# Patient Record
Sex: Male | Born: 2005 | Race: Black or African American | Hispanic: No | Marital: Single | Smoking: Never smoker
Health system: Southern US, Community
[De-identification: ages and names within clinical notes are randomized; demographics above are authoritative.]

## PROBLEM LIST (undated history)

## (undated) DIAGNOSIS — T8859XA Other complications of anesthesia, initial encounter: Secondary | ICD-10-CM

---
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
## 2023-02-28 ENCOUNTER — Emergency Department
Admission: EM | Admit: 2023-02-28 | Discharge: 2023-03-01 | Disposition: A | Discharge status: Home / Self Care | Payer: Medicaid Other | Attending: Emergency Medicine | Admitting: Emergency Medicine

## 2023-02-28 ENCOUNTER — Other Ambulatory Visit: Payer: Self-pay

## 2023-02-28 DIAGNOSIS — J029 Acute pharyngitis, unspecified: Secondary | ICD-10-CM | POA: Diagnosis not present

## 2023-02-28 DIAGNOSIS — I10 Essential (primary) hypertension: Secondary | ICD-10-CM | POA: Insufficient documentation

## 2023-02-28 LAB — GROUP A STREP BY PCR: Group A Strep by PCR: NOT DETECTED

## 2023-02-28 NOTE — ED Triage Notes (Signed)
Pt presents to ER with c/o sore throat that has been going on for around one week.  Pt states pain is mostly in upper left side of his throat, with some radiation to his left jaw around his ear.  Pt denies pain when opening mouth.  Denies any fever, sick contacts, or cold/flu symptoms.  Pt states he does have some pain with swallowing.  Pt otherwise A&O x4 and in NAD in triage.

## 2023-03-01 MED ORDER — DEXAMETHASONE 10 MG/ML FOR PEDIATRIC ORAL USE
10.0000 mg | Freq: Once | INTRAMUSCULAR | Status: AC
  Administered 2023-03-01: 10 mg via ORAL
  Filled 2023-03-01: qty 1

## 2023-03-01 MED ORDER — AMOXICILLIN-POT CLAVULANATE 875-125 MG PO TABS
1.0000 | ORAL_TABLET | Freq: Once | ORAL | Status: AC
  Administered 2023-03-01: 1 via ORAL
  Filled 2023-03-01: qty 1

## 2023-03-01 MED ORDER — AMOXICILLIN-POT CLAVULANATE 875-125 MG PO TABS: 1.0000 | ORAL_TABLET | Freq: Two times a day (BID) | ORAL | 0 refills | Status: AC

## 2023-03-01 NOTE — Discharge Instructions (Signed)
Please take the full course of prescribed antibiotics.  Follow-up with your primary care doctor, or you could also choose to follow-up with the ENT specialists listed in this information.  If you feel like you are getting worse instead of better, such as having trouble swallowing your saliva or any food or drink, or you feel like the swelling is getting worse, please return immediately to the emergency department.

## 2023-03-01 NOTE — ED Notes (Signed)
Pt dc to home. Voices understanding of DC instructions. Pt ambulatory out of dept with steady gait

## 2023-03-01 NOTE — ED Provider Notes (Signed)
[facility] Provider Note    Event Date/Time   First MD Initiated Contact with Patient 02/28/23 2344     (approximate)   History   Sore Throat   HPI Chase Walton is a 17 y.o. male with no chronic medical issues who presents for evaluation of sore throat.  He said he has had a sore throat for about a week, but it feels like it is gradually getting a little bit worse, now he has some swelling below his left lower jaw and his neck.  It hurts for him to swallow, but it is not difficult to do so.  He is not having any trouble breathing.  He feels like the pain is radiating up through the left side of his face and towards his ear but he is not specifically having ear pain.  He has not had any dental problems or issues like this in the past.  No neck stiffness.  No fever, nausea, vomiting, chest pain, shortness of breath, cough, nor abdominal pain.     Physical Exam   Triage Vital Signs: ED Triage Vitals  Enc Vitals Group     BP 02/28/23 2247 (!) 146/93     Pulse Rate 02/28/23 2247 68     Resp 02/28/23 2247 17     Temp 02/28/23 2247 98.2 F (36.8 C)     Temp Source 02/28/23 2247 Oral     SpO2 02/28/23 2247 100 %     Weight 02/28/23 2246 72.6 kg (160 lb)     Height 02/28/23 2246 1.88 m ( )     Head Circumference --      Peak Flow --      Pain Score 02/28/23 2245 7     Pain Loc --      Pain Edu? --      Excl. in GC? --     Most recent vital signs: Vitals:   02/28/23 2247  BP: (!) 146/93  Pulse: 68  Resp: 17  Temp: 98.2 F (36.8 C)  SpO2: 100%    General: Awake, no distress.  Well-appearing, healthy at baseline. HEENT: No obvious angioedema.  Patient has some palpable induration or lymphadenopathy beneath her his left mandible and he said that is the area that is tender.  It does not extend down his neck nor up into his face.  He has no tenderness to palpation of his sinuses nor of his face with no obvious facial swelling or  cellulitis.  Ears are clear and clean bilaterally with normal-appearing tympanic membranes.  His oropharynx appears normal and there is no obvious tonsillitis, posterior oropharynx swelling suggestive of peritonsillar abscess, and beneath his tongue is soft, nontender, and not swollen nor indurated.  No obvious odontogenic infection including Ludwig's angina. CV:  Good peripheral perfusion.  Resp:  Normal effort. Speaking easily and comfortably, no accessory muscle usage nor intercostal retractions.   Abd:  No distention.  Other:  No tripoding, no drooling, speaking softly but clearly with a normal voice.   ED Results / Procedures / Treatments   Labs (all labs ordered are listed, but only abnormal results are displayed) Labs Reviewed  GROUP A STREP BY PCR      PROCEDURES:  Critical Care performed: No  Procedures    IMPRESSION / MDM / ASSESSMENT AND PLAN / ED COURSE  I reviewed the triage vital signs and the nursing notes.  Differential diagnosis includes, but is not limited to, tonsillitis, pharyngitis, neck abscess, peritonsillar abscess, facial abscess or cellulitis.  Patient's presentation is most consistent with acute, uncomplicated illness.  Labs/studies ordered: Group A strep PCR ordered in triage  Interventions/Medications given:  Medications  amoxicillin-clavulanate (AUGMENTIN) 875-125 MG per tablet 1 tablet (1 tablet Oral Given 03/01/23 0030)  dexamethasone (DECADRON) 10 MG/ML injection for Pediatric ORAL use 10 mg (10 mg Oral Given 03/01/23 0030)    (Note:  hospital course my include additional interventions and/or labs/studies not listed above.)   Patient is well-appearing and in no distress, vital signs normal other than hypertension which I think is situational.  Overall his physical exam is reassuring.  He does not have an obvious peritonsillar abscess and again his physical exam is unremarkable except for some swelling beneath  the left side of his mandible which could be reactive or could be consistent with an infectious process or developing phlegmon.  I talked with him about all of this and explained that I could get a CT of the neck to be certain, but I strongly suspect based on his clinical presentation that the plan will be the same regardless: Trial of outpatient antibiotics and a dose of Decadron to help with the swelling, with strict return precautions if he gets worse.  He understands and would prefer not to do the imaging tonight which I think is appropriate as it would likely be unnecessary, not change the clinical course, and quite expensive as well as a unnecessary dose of radiation.  However I gave him strict return precautions both verbally and in the paperwork that if he feels like the swelling or any other symptoms are getting worse, he should return immediately.  He can follow-up with his primary care provider, but I also gave him follow-up information with the ENT clinic.      FINAL CLINICAL IMPRESSION(S) / ED DIAGNOSES   Final diagnoses:  Acute pharyngitis, unspecified etiology     Rx / DC Orders   ED Discharge Orders          Ordered    amoxicillin-clavulanate (AUGMENTIN) 875-125 MG tablet  2 times daily        03/01/23 0051             Note:  This document was prepared using Dragon voice recognition software and may include unintentional dictation errors.   Loleta Rose, MD 03/01/23 [phone]

## 2024-02-17 ENCOUNTER — Other Ambulatory Visit: Payer: Self-pay

## 2024-02-17 ENCOUNTER — Emergency Department

## 2024-02-17 ENCOUNTER — Emergency Department
Admission: EM | Admit: 2024-02-17 | Discharge: 2024-02-17 | Disposition: A | Discharge status: Home / Self Care | Attending: Emergency Medicine | Admitting: Emergency Medicine

## 2024-02-17 DIAGNOSIS — W25XXXA Contact with sharp glass, initial encounter: Secondary | ICD-10-CM | POA: Diagnosis not present

## 2024-02-17 DIAGNOSIS — S51012A Laceration without foreign body of left elbow, initial encounter: Secondary | ICD-10-CM | POA: Insufficient documentation

## 2024-02-17 DIAGNOSIS — Z23 Encounter for immunization: Secondary | ICD-10-CM | POA: Insufficient documentation

## 2024-02-17 DIAGNOSIS — Y93G1 Activity, food preparation and clean up: Secondary | ICD-10-CM | POA: Diagnosis not present

## 2024-02-17 MED ORDER — CEPHALEXIN 500 MG PO CAPS: 500.0000 mg | ORAL_CAPSULE | Freq: Three times a day (TID) | ORAL | 0 refills | Status: AC

## 2024-02-17 MED ORDER — LIDOCAINE HCL 2 % IJ SOLN
20.0000 mL | Freq: Once | INTRAMUSCULAR | Status: DC
Start: 2024-02-17 — End: 2024-02-17

## 2024-02-17 MED ORDER — LIDOCAINE HCL (PF) 1 % IJ SOLN: 5.0000 mL | Freq: Once | INTRAMUSCULAR | Status: DC

## 2024-02-17 MED ORDER — TRANEXAMIC ACID FOR EPISTAXIS
500.0000 mg | Freq: Once | TOPICAL | Status: AC
  Administered 2024-02-17: 500 mg via TOPICAL
  Filled 2024-02-17: qty 10

## 2024-02-17 MED ORDER — TETANUS-DIPHTH-ACELL PERTUSSIS 5-2.5-18.5 LF-MCG/0.5 IM SUSY
0.5000 mL | PREFILLED_SYRINGE | Freq: Once | INTRAMUSCULAR | Status: AC
Start: 2024-02-17 — End: 2024-02-17
  Administered 2024-02-17: 0.5 mL via INTRAMUSCULAR
  Filled 2024-02-17: qty 0.5

## 2024-02-17 MED ORDER — LIDOCAINE HCL (PF) 2 % IJ SOLN
20.0000 mL | Freq: Once | INTRAMUSCULAR | Status: DC
Start: 2024-02-17 — End: 2024-02-18
  Filled 2024-02-17: qty 20

## 2024-02-17 MED ORDER — LIDOCAINE-EPINEPHRINE-TETRACAINE (LET) TOPICAL GEL
3.0000 mL | Freq: Once | TOPICAL | Status: AC
  Administered 2024-02-17: 3 mL via TOPICAL
  Filled 2024-02-17: qty 3

## 2024-02-17 MED ORDER — CEPHALEXIN 500 MG PO CAPS
500.0000 mg | ORAL_CAPSULE | Freq: Once | ORAL | Status: AC
  Administered 2024-02-17: 500 mg via ORAL
  Filled 2024-02-17: qty 1

## 2024-02-17 NOTE — ED Triage Notes (Signed)
 Pt was cleaning up glass and was cleaning it up and then he hit his elbow on the glass cutting it.

## 2024-02-17 NOTE — ED Provider Notes (Signed)
 [facility] Provider Note    Event Date/Time   First MD Initiated Contact with Patient 02/17/24 1935     (approximate)   History   Laceration   HPI  Chase Walton is a 18 y.o. male with no significant past medical history presents emergency department with a laceration to the left elbow.  Patient was cleaning up glass when he leaned back on a bottle that was broken in a trash bag and cut his left elbow.  Unsure of his last Tdap.  Denies numbness or tingling.  Has full range of motion.      Physical Exam   Triage Vital Signs: ED Triage Vitals  Encounter Vitals Group     BP 02/17/24 1851 (!) 143/93     Systolic BP Percentile --      Diastolic BP Percentile --      Pulse Rate 02/17/24 1851 82     Resp 02/17/24 1851 18     Temp 02/17/24 1851 97.8 F (36.6 C)     Temp Source 02/17/24 1851 Oral     SpO2 02/17/24 1851 98 %     Weight 02/17/24 1852 170 lb (77.1 kg)     Height 02/17/24 1852 6\' 2"  (1.88 m)     Head Circumference --      Peak Flow --      Pain Score 02/17/24 1852 0     Pain Loc --      Pain Education --      Exclude from Growth Chart --     Most recent vital signs: Vitals:   02/17/24 1851  BP: (!) 143/93  Pulse: 82  Resp: 18  Temp: 97.8 F (36.6 C)  SpO2: 98%     General: Awake, no distress.   CV:  Good peripheral perfusion. regular rate and  rhythm Resp:  Normal effort.  Abd:  No distention.   Other:  Left elbow with laceration proximal to the joint of the elbow, bursa area appears to be very swollen, active bleeding noted, pressure dressing applied   ED Results / Procedures / Treatments   Labs (all labs ordered are listed, but only abnormal results are displayed) Labs Reviewed - No data to display   EKG     RADIOLOGY X-ray of the left elbow CT left elbow    PROCEDURES:   .Laceration Repair  Date/Time: 02/17/2024 10:58 PM  Performed by: Delsie Figures, PA-C Authorized by: Delsie Figures, PA-C   Consent:    Consent obtained:  Verbal   Consent given by:  Patient   Risks, benefits, and alternatives were discussed: yes     Risks discussed:  Pain, retained foreign body, tendon damage, poor cosmetic result, need for additional repair, infection, nerve damage, poor wound healing and vascular damage   Alternatives discussed:  Referral Universal protocol:    Procedure explained and questions answered to patient or proxy's satisfaction: yes     Immediately prior to procedure, a time out was called: yes     Patient identity confirmed:  Verbally with patient Anesthesia:    Anesthesia method:  Topical application and local infiltration   Topical anesthetic:  LET   Local anesthetic:  Lidocaine 2% WITH epi Laceration details:    Location:  Shoulder/arm   Shoulder/arm location:  L upper arm   Length (cm):  5 Pre-procedure details:    Preparation:  Patient was prepped and draped in usual sterile fashion and imaging obtained to evaluate for  foreign bodies Exploration:    Limited defect created (wound extended): no     Hemostasis achieved with:  Direct pressure, LET and epinephrine   Imaging obtained: x-ray     Imaging outcome: foreign body not noted     Wound exploration: wound explored through full range of motion and entire depth of wound visualized     Wound extent: areolar tissue not violated, fascia not violated, no foreign body, no signs of injury, no nerve damage, no tendon damage and no underlying fracture     Contaminated: no   Treatment:    Area cleansed with:  Povidone-iodine and saline   Amount of cleaning:  Standard   Irrigation solution:  Sterile saline   Irrigation method:  Pressure wash, syringe and tap   Debridement:  None   Undermining:  None   Scar revision: no     Layers/structures repaired:  Deep subcutaneous Deep subcutaneous:    Suture size:  4-0   Suture material:  Vicryl   Suture technique:  Simple interrupted   Number of sutures:  5 Skin  repair:    Repair method:  Sutures   Suture size:  4-0   Suture material:  Nylon   Suture technique:  Simple interrupted   Number of sutures:  7 Approximation:    Approximation:  Close Repair type:    Repair type:  Intermediate Post-procedure details:    Dressing:  Non-adherent dressing and bulky dressing   Procedure completion:  Tolerated well, no immediate complications Comments:     Patient had a lot of bleeding, had remove clots, bursa was very swollen, no tendon injury noted, no pulsatile bleeding noted  Chief Complaint  Patient presents with   Laceration      MEDICATIONS ORDERED IN ED: Medications  lidocaine HCl (PF) (XYLOCAINE) 2 % injection 20 mL (has no administration in time range)  cephALEXin (KEFLEX) capsule 500 mg (has no administration in time range)  lidocaine-EPINEPHrine-tetracaine (LET) topical gel (3 mLs Topical Given by Other 02/17/24 2007)  Tdap (BOOSTRIX) injection 0.5 mL (0.5 mLs Intramuscular Given 02/17/24 2024)  tranexamic acid (CYKLOKAPRON) 1000 MG/10ML topical solution 500 mg (500 mg Topical Given by Other 02/17/24 2205)     IMPRESSION / MDM / ASSESSMENT AND PLAN / ED COURSE  I reviewed the triage vital signs and the nursing notes.                              Differential diagnosis includes, but is not limited to, laceration, laceration of the bursa, laceration into the joint space, tendon injury, arterial injury, venous injury  Patient's presentation is most consistent with acute illness / injury with system symptoms.   X-ray left elbow independently reviewed interpreted by me as being negative for any acute abnormality, confirmed by radiology  While trying to suture the area we had already applied let, use lidocaine 2% with epinephrine, bleeding continues, large clots removed, pressure was applied, the bursa appears to be swollen at this time.  Discussed with Dr. Cam Cava, states get a CT and try TXA soaked on gauze.   CT of the left elbow,  independently reviewed interpreted by me as being negative for any acute abnormality  Dr.Mian into see the patient.  States the bleeding does not appear to be arterial.  Go ahead and suture if CT negative and apply pressure dressing.  I did explain these findings to the patient and his mother.  They are  to call orthopedics tomorrow for a recheck.  Pressure dressing was applied.  Tdap was updated.  He was given Keflex p.o. here prior to discharge.  Tylenol/ibuprofen for pain if needed.  Return if worsening.  They are both in agreement treatment plan.  He is discharged stable condition.   FINAL CLINICAL IMPRESSION(S) / ED DIAGNOSES   Final diagnoses:  Elbow laceration, left, initial encounter     Rx / DC Orders   ED Discharge Orders          Ordered    cephALEXin (KEFLEX) 500 MG capsule  3 times daily        02/17/24 2257             Note:  This document was prepared using Dragon voice recognition software and may include unintentional dictation errors.    Delsie Figures, PA-C 02/17/24 2302    Collis Deaner, MD 02/18/24 [phone]

## 2024-02-17 NOTE — Discharge Instructions (Signed)
 Follow-up with your regular doctor as needed.  Follow-up with orthopedics tomorrow or the next day.  Please call them for an appointment.  Keep the pressure bandage on the elbow for at least 24 hours.  After that we changed the dressing please rewrap it with the Ace wrap on top of the bandage.  Return the emergency department if it is worsening.  Take the antibiotic as prescribed.  Tylenol or ibuprofen for pain as needed

## 2024-11-13 ENCOUNTER — Ambulatory Visit: Payer: Self-pay

## 2024-11-13 DIAGNOSIS — Z113 Encounter for screening for infections with a predominantly sexual mode of transmission: Secondary | ICD-10-CM

## 2024-11-13 DIAGNOSIS — Z202 Contact with and (suspected) exposure to infections with a predominantly sexual mode of transmission: Secondary | ICD-10-CM

## 2024-11-13 LAB — HM HIV SCREENING LAB: HM HIV Screening: NEGATIVE

## 2024-11-13 MED ORDER — CEFTRIAXONE SODIUM 500 MG IJ SOLR
500.0000 mg | Freq: Once | INTRAMUSCULAR | Status: AC
  Administered 2024-11-13: 500 mg via INTRAMUSCULAR

## 2024-11-13 NOTE — Progress Notes (Signed)
 Pt is here for STD screening and a contact to Gonorrhea. Ceftriaxone  500 mg IM injection given at the [facility]  and patient tolerated well to injection. Patient was given the opportunity to ask questions for any clarifications. Questions answered. Brochure and condoms given. Wilkie Drought, RN.

## 2024-11-13 NOTE — Progress Notes (Signed)
 " [facility] Department STI clinic [address] [place] [address] Main phone: [phone]  STI screening visit  Subjective:  Chase Walton is a 19 y.o. male being seen today for an STI screening visit. The patient reports they do not have symptoms.    Patient has the following medical conditions:  There are no active problems to display for this patient.  Chief Complaint  Patient presents with   SEXUALLY TRANSMITTED DISEASE    STD screening/No symptoms/Contact Gonorrhea   HPI Patient reports exposure to gonorrhea. Last contact was 1 week ago. He has no symptoms.   Reproductive considerations: Does the patient or their partner desires a pregnancy in the next year? No  See flowsheet for further details and programmatic requirements  Hyperlink available at the top of the signed note in blue.  Flow sheet content below:  Pregnancy Intention Screening Does the patient want to become pregnant in the next year?: N/A Does the patient's partner want to become pregnant in the next year?: No Would the patient like to discuss contraceptive options today?: N/A Reason For STD Screen STD Screening: Is a contact Have you ever had an STD?: Yes History of Antibiotic use in the past 2 weeks?: No STD Symptoms Denies all: Yes Risk Factors for Hep B Household, sexual, or needle sharing contact of a person infected with Hep B: No Sexual contact with a person who uses drugs not as prescribed?: No Currently or Ever used drugs not as prescribed: No HIV Positive: No PRep Patient: No Men who have sex with men: No Have Hepatitis C: No History of Incarceration: No History of Homeslessness?: No Anal sex following anal drug use?: No Risk Factors for Hep C Currently using drugs not as prescribed: No Sexual partner(s) currently using drugs as not prescribed: No History of drug use: No HIV Positive: No People with a history of incarceration:  No People born between the years of 7 and 11: No Counseling Patient counseled to use condoms with all sex: Condoms given Test results given to patient Patient counseled to use condoms with all sex: Condoms given  Screening for MPX risk:  Unexplained rash?  No   MSM?  No   Multiple or anonymous sex partners?  No   Any close or sexual contact with a person  diagnosed with MPX?  No   Any outside the [place]  where MPX is endemic?  No   High clinical suspicion for MPX?    -Unlikely to be chickenpox    -Lymphadenopathy    -Rash that presents in same phase of       evolution on any given body part  No   Does this patient meet CDC recommendations for vaccination against MPOX? No  You already have or anticipate having the following risks:  Your sex partner has the following risks: You're traveling to a county with a clade I MPOX outbreak and anticipate these risks: Occupational exposure  You had known or suspected exposure to someone with monkeypox You had a sex partner in the past 2 weeks who was diagnosed with monkeypox You are a gay, bisexual, or other man who has sex with men, or are transgender or nonbinary and in the past 6 months have had any of the following: - A new diagnosis of one or more sexually transmitted diseases (e.g., chlamydia, gonorrhea, or syphilis) - More than one sex partner You have had any of the following in the past 6 months: -  Sex at a commercial sex venue (like a sex club or bathhouse) - Sex related to a large commercial event   or in a geographic area (city or county for example) where mpox virus transmission is occurring Sex with a new partner Sex at a commercial sex venue (e.g., a sex club or bathhouse) Sex in it consultant for money, goods, drugs, or other trade Sex in association with a large public event (e.g., a rave, party, or festival) i.e. certain people who work in a tax adviser or healthcare facility   Infectious disease screenings: Vaccinated against  HPV? Unknown  HIV Ever had a positive? No Last test: 2023 Results in chart:  Lab Results  Component Value Date   HMHIVSCREEN Negative - Validated 11/23/2021   No results found for: HIV   Hep B Hep B status: unknown or no prior testing Received HBV vaccination? Unknown Received HBV testing for immunity? Unknown Results in chart:  No components found for: HMHEPBSCREEN  Do they qualify for HBV screening today? No Criteria:  -Household, sexual or needle sharing contact with HBV -History of drug use or homelessness -HIV positive -Those with known Hep C  Hep C Hep C status: unknown or no prior testing Results in chart:  No results found for: HMHEPCSCREEN No components found for: HEPC  Do they qualify for HCV screening today? No Criteria - since the last HCV result, does the patient have any of the following? - Current drug use - Have a partner with drug use - Has been incarcerated  Immunization history:  Immunization History  Administered Date(s) Administered   Tdap 02/17/2024    The following portions of the patient's history were reviewed and updated as appropriate: allergies, current medications, past medical history, past social history, past surgical history and problem list.  Substance use screenings:  Uses tobacco products? No Uses vapes? No Uses non-injectable substances that alter your mental status? No Uses non-prescribed injectable substances? No  Immunization History  Administered Date(s) Administered   Tdap 02/17/2024    The following portions of the patient's history were reviewed and updated as appropriate: allergies, current medications, past medical history, past social history, past surgical history and problem list.  Objective:  There were no vitals filed for this visit.  Physical Exam Constitutional:      Appearance: Normal appearance. He is normal weight.  HENT:     Head: Normocephalic.     Mouth/Throat:     Lips: Pink. No lesions.      Mouth: Mucous membranes are moist.  Eyes:     General: No scleral icterus.       Right eye: No discharge.        Left eye: No discharge.  Pulmonary:     Effort: Pulmonary effort is normal.  Abdominal:     General: Abdomen is flat.  Skin:    General: Skin is warm and dry.     Findings: No rash.  Neurological:     General: No focal deficit present.     Mental Status: He is alert.  Psychiatric:        Mood and Affect: Mood normal.        Behavior: Behavior normal.    Assessment and Plan:  Chase Walton is a 19 y.o. male presenting to the [facility] Department for STI screening.  Patient accepted the following screenings: urine CT/GC, HIV, and RPR  1. Gonorrhea contact (Primary)  - cefTRIAXone  (ROCEPHIN ) injection 500 mg - Discussed no sex for  at least 7 days  2. Screening for venereal disease  - HIV [place] [facility] - Syphilis Serology, [place] [facility] - Chlamydia/GC NAA, Confirmation   Counseling: Recommended condom use with all sex Discussed importance of condom use for STI prevention Discussed time line for [facility] results and that patient will be called with positive results and encouraged patient to call if they had not heard in 2 weeks Recommended repeat testing in 3 months with positive results. Recommended returning for continued or worsening symptoms.   Return in about 3 months (around 02/11/2025).  No future appointments.  Damien FORBES Satchel, NP "

## 2024-11-16 LAB — CHLAMYDIA/GC NAA, CONFIRMATION
Chlamydia trachomatis, NAA: NEGATIVE
Neisseria gonorrhoeae, NAA: NEGATIVE
